# Patient Record
Sex: Male | Born: 1970
Health system: Southern US, Community
[De-identification: ages and names within clinical notes are randomized; demographics above are authoritative.]

## PROBLEM LIST (undated history)

## (undated) DIAGNOSIS — E785 Hyperlipidemia, unspecified: Secondary | ICD-10-CM

## (undated) DIAGNOSIS — N200 Calculus of kidney: Secondary | ICD-10-CM

## (undated) HISTORY — DX: Calculus of kidney: N20.0

## (undated) HISTORY — PX: ANTERIOR CRUCIATE LIGAMENT REPAIR: SHX115

## (undated) HISTORY — DX: Hyperlipidemia, unspecified: E78.5

---

## 2004-08-03 ENCOUNTER — Ambulatory Visit: Payer: Self-pay | Admitting: Family Medicine

## 2005-11-07 ENCOUNTER — Ambulatory Visit (HOSPITAL_COMMUNITY): Admission: RE | Admit: 2005-11-07 | Discharge: 2005-11-07 | Payer: Self-pay | Admitting: Internal Medicine

## 2006-04-06 ENCOUNTER — Emergency Department (HOSPITAL_COMMUNITY): Admission: EM | Admit: 2006-04-06 | Discharge: 2006-04-07 | Payer: Self-pay | Admitting: Emergency Medicine

## 2006-04-10 ENCOUNTER — Ambulatory Visit (HOSPITAL_BASED_OUTPATIENT_CLINIC_OR_DEPARTMENT_OTHER): Admission: RE | Admit: 2006-04-10 | Discharge: 2006-04-10 | Payer: Self-pay | Admitting: Orthopedic Surgery

## 2017-05-16 DIAGNOSIS — E784 Other hyperlipidemia: Secondary | ICD-10-CM | POA: Diagnosis not present

## 2017-05-16 DIAGNOSIS — R7301 Impaired fasting glucose: Secondary | ICD-10-CM | POA: Diagnosis not present

## 2017-05-16 DIAGNOSIS — Z125 Encounter for screening for malignant neoplasm of prostate: Secondary | ICD-10-CM | POA: Diagnosis not present

## 2017-05-16 DIAGNOSIS — Z0000 Encounter for general adult medical examination without abnormal findings: Secondary | ICD-10-CM | POA: Diagnosis not present

## 2017-05-23 DIAGNOSIS — Z1389 Encounter for screening for other disorder: Secondary | ICD-10-CM | POA: Diagnosis not present

## 2017-05-23 DIAGNOSIS — E784 Other hyperlipidemia: Secondary | ICD-10-CM | POA: Diagnosis not present

## 2017-05-23 DIAGNOSIS — R7301 Impaired fasting glucose: Secondary | ICD-10-CM | POA: Diagnosis not present

## 2017-05-23 DIAGNOSIS — Z0000 Encounter for general adult medical examination without abnormal findings: Secondary | ICD-10-CM | POA: Diagnosis not present

## 2017-05-23 DIAGNOSIS — G4700 Insomnia, unspecified: Secondary | ICD-10-CM | POA: Diagnosis not present

## 2017-05-23 DIAGNOSIS — R209 Unspecified disturbances of skin sensation: Secondary | ICD-10-CM | POA: Diagnosis not present

## 2018-05-19 DIAGNOSIS — Z125 Encounter for screening for malignant neoplasm of prostate: Secondary | ICD-10-CM | POA: Diagnosis not present

## 2018-05-19 DIAGNOSIS — Z0000 Encounter for general adult medical examination without abnormal findings: Secondary | ICD-10-CM | POA: Diagnosis not present

## 2018-05-19 DIAGNOSIS — E7849 Other hyperlipidemia: Secondary | ICD-10-CM | POA: Diagnosis not present

## 2018-05-19 DIAGNOSIS — R82998 Other abnormal findings in urine: Secondary | ICD-10-CM | POA: Diagnosis not present

## 2018-05-19 DIAGNOSIS — R7301 Impaired fasting glucose: Secondary | ICD-10-CM | POA: Diagnosis not present

## 2018-05-26 DIAGNOSIS — E7849 Other hyperlipidemia: Secondary | ICD-10-CM | POA: Diagnosis not present

## 2018-05-26 DIAGNOSIS — R740 Nonspecific elevation of levels of transaminase and lactic acid dehydrogenase [LDH]: Secondary | ICD-10-CM | POA: Diagnosis not present

## 2018-05-26 DIAGNOSIS — R209 Unspecified disturbances of skin sensation: Secondary | ICD-10-CM | POA: Diagnosis not present

## 2018-05-26 DIAGNOSIS — Z1389 Encounter for screening for other disorder: Secondary | ICD-10-CM | POA: Diagnosis not present

## 2018-05-26 DIAGNOSIS — Z0000 Encounter for general adult medical examination without abnormal findings: Secondary | ICD-10-CM | POA: Diagnosis not present

## 2018-05-26 DIAGNOSIS — R7301 Impaired fasting glucose: Secondary | ICD-10-CM | POA: Diagnosis not present

## 2019-01-12 DIAGNOSIS — H31002 Unspecified chorioretinal scars, left eye: Secondary | ICD-10-CM | POA: Diagnosis not present

## 2019-02-01 DIAGNOSIS — Z20828 Contact with and (suspected) exposure to other viral communicable diseases: Secondary | ICD-10-CM | POA: Diagnosis not present

## 2019-05-21 DIAGNOSIS — R7301 Impaired fasting glucose: Secondary | ICD-10-CM | POA: Diagnosis not present

## 2019-05-21 DIAGNOSIS — Z0000 Encounter for general adult medical examination without abnormal findings: Secondary | ICD-10-CM | POA: Diagnosis not present

## 2019-05-21 DIAGNOSIS — Z125 Encounter for screening for malignant neoplasm of prostate: Secondary | ICD-10-CM | POA: Diagnosis not present

## 2019-05-21 DIAGNOSIS — E7849 Other hyperlipidemia: Secondary | ICD-10-CM | POA: Diagnosis not present

## 2019-05-28 DIAGNOSIS — Z1331 Encounter for screening for depression: Secondary | ICD-10-CM | POA: Diagnosis not present

## 2019-05-28 DIAGNOSIS — E7849 Other hyperlipidemia: Secondary | ICD-10-CM | POA: Diagnosis not present

## 2019-05-28 DIAGNOSIS — R7401 Elevation of levels of liver transaminase levels: Secondary | ICD-10-CM | POA: Diagnosis not present

## 2019-05-28 DIAGNOSIS — R7301 Impaired fasting glucose: Secondary | ICD-10-CM | POA: Diagnosis not present

## 2019-05-28 DIAGNOSIS — Z0000 Encounter for general adult medical examination without abnormal findings: Secondary | ICD-10-CM | POA: Diagnosis not present

## 2019-05-28 DIAGNOSIS — J45909 Unspecified asthma, uncomplicated: Secondary | ICD-10-CM | POA: Diagnosis not present

## 2019-06-25 ENCOUNTER — Other Ambulatory Visit: Payer: Self-pay

## 2019-06-25 DIAGNOSIS — Z20822 Contact with and (suspected) exposure to covid-19: Secondary | ICD-10-CM

## 2019-06-26 LAB — NOVEL CORONAVIRUS, NAA: SARS-CoV-2, NAA: NOT DETECTED

## 2019-08-18 DIAGNOSIS — M545 Low back pain: Secondary | ICD-10-CM | POA: Diagnosis not present

## 2019-10-16 ENCOUNTER — Other Ambulatory Visit: Payer: Self-pay | Admitting: Internal Medicine

## 2019-10-16 DIAGNOSIS — E785 Hyperlipidemia, unspecified: Secondary | ICD-10-CM

## 2019-10-28 ENCOUNTER — Other Ambulatory Visit: Payer: Self-pay

## 2019-10-28 ENCOUNTER — Ambulatory Visit
Admission: RE | Admit: 2019-10-28 | Discharge: 2019-10-28 | Disposition: A | Discharge status: Home / Self Care | Payer: Self-pay | Source: Ambulatory Visit | Attending: Internal Medicine | Admitting: Internal Medicine

## 2019-10-28 DIAGNOSIS — E785 Hyperlipidemia, unspecified: Secondary | ICD-10-CM

## 2019-11-30 DIAGNOSIS — M7711 Lateral epicondylitis, right elbow: Secondary | ICD-10-CM | POA: Diagnosis not present

## 2020-04-21 DIAGNOSIS — Z20822 Contact with and (suspected) exposure to covid-19: Secondary | ICD-10-CM | POA: Diagnosis not present

## 2020-04-24 ENCOUNTER — Other Ambulatory Visit: Payer: Self-pay | Admitting: Family

## 2020-04-24 ENCOUNTER — Telehealth: Payer: Self-pay | Admitting: Family

## 2020-04-24 DIAGNOSIS — U071 COVID-19: Secondary | ICD-10-CM

## 2020-04-24 DIAGNOSIS — J4520 Mild intermittent asthma, uncomplicated: Secondary | ICD-10-CM

## 2020-04-24 DIAGNOSIS — Z6828 Body mass index (BMI) 28.0-28.9, adult: Secondary | ICD-10-CM

## 2020-04-24 NOTE — Progress Notes (Signed)
I connected by phone with Matthew Steele on 04/24/2020 at 2:29 PM to discuss the potential use of a new treatment for mild to moderate COVID-19 viral infection in non-hospitalized patients.  This patient is a 49 y.o. male that meets the FDA criteria for Emergency Use Authorization of COVID monoclonal antibody casirivimab/imdevimab.  Has a (+) direct SARS-CoV-2 viral test result  Has mild or moderate COVID-19   Is NOT hospitalized due to COVID-19  Is within 10 days of symptom onset  Has at least one of the high risk factor(s) for progression to severe COVID-19 and/or hospitalization as defined in EUA.  Specific high risk criteria : BMI > 25 and Chronic Lung Disease   I have spoken and communicated the following to the patient or parent/caregiver regarding COVID monoclonal antibody treatment:  1. FDA has authorized the emergency use for the treatment of mild to moderate COVID-19 in adults and pediatric patients with positive results of direct SARS-CoV-2 viral testing who are 23 years of age and older weighing at least 40 kg, and who are at high risk for progressing to severe COVID-19 and/or hospitalization.  2. The significant known and potential risks and benefits of COVID monoclonal antibody, and the extent to which such potential risks and benefits are unknown.  3. Information on available alternative treatments and the risks and benefits of those alternatives, including clinical trials.  4. Patients treated with COVID monoclonal antibody should continue to self-isolate and use infection control measures (e.g., wear mask, isolate, social distance, avoid sharing personal items, clean and disinfect "high touch" surfaces, and frequent handwashing) according to CDC guidelines.   5. The patient or parent/caregiver has the option to accept or refuse COVID monoclonal antibody treatment.  After reviewing this information with the patient, The patient agreed to proceed with receiving  casirivimab\imdevimab infusion and will be provided a copy of the Fact sheet prior to receiving the infusion.    Mauricio Po, NP 04/24/2020 2:29 PM

## 2020-04-24 NOTE — Telephone Encounter (Signed)
Called to Discuss with patient about Covid symptoms and the use of the monoclonal antibody infusion for those with mild to moderate Covid symptoms and at a high risk of hospitalization.     Cough Congestion, fatigue; Symptom onset 8/27. Asthma BMI >25.   Pt appears to qualify for this infusion due to co-morbid conditions and/or a member of an at-risk group in accordance with the FDA Emergency Use Authorization.  Initial onset of symptoms was 04/21/20. Currently experiencing cough, congestion and fatigue. Risk factors include BMI >25 and Asthma.   Scheduled for infusion and orders placed.   Terri Piedra, NP

## 2020-04-26 ENCOUNTER — Ambulatory Visit (HOSPITAL_COMMUNITY)
Admission: RE | Admit: 2020-04-26 | Discharge: 2020-04-26 | Disposition: A | Discharge status: Home / Self Care | Payer: BC Managed Care – PPO | Source: Ambulatory Visit | Attending: Pulmonary Disease | Admitting: Pulmonary Disease

## 2020-04-26 DIAGNOSIS — J4520 Mild intermittent asthma, uncomplicated: Secondary | ICD-10-CM | POA: Diagnosis not present

## 2020-04-26 DIAGNOSIS — Z6828 Body mass index (BMI) 28.0-28.9, adult: Secondary | ICD-10-CM | POA: Diagnosis not present

## 2020-04-26 DIAGNOSIS — U071 COVID-19: Secondary | ICD-10-CM | POA: Insufficient documentation

## 2020-04-26 MED ORDER — SODIUM CHLORIDE 0.9 % IV SOLN: INTRAVENOUS | Status: DC | PRN

## 2020-04-26 MED ORDER — SODIUM CHLORIDE 0.9 % IV SOLN
1200.0000 mg | Freq: Once | INTRAVENOUS | Status: AC
  Administered 2020-04-26: 1200 mg via INTRAVENOUS

## 2020-04-26 MED ORDER — EPINEPHRINE 0.3 MG/0.3ML IJ SOAJ: 0.3000 mg | Freq: Once | INTRAMUSCULAR | Status: DC | PRN

## 2020-04-26 MED ORDER — METHYLPREDNISOLONE SODIUM SUCC 125 MG IJ SOLR: 125.0000 mg | Freq: Once | INTRAMUSCULAR | Status: DC | PRN

## 2020-04-26 MED ORDER — ALBUTEROL SULFATE HFA 108 (90 BASE) MCG/ACT IN AERS: 2.0000 | INHALATION_SPRAY | Freq: Once | RESPIRATORY_TRACT | Status: DC | PRN

## 2020-04-26 MED ORDER — FAMOTIDINE IN NACL 20-0.9 MG/50ML-% IV SOLN: 20.0000 mg | Freq: Once | INTRAVENOUS | Status: DC | PRN

## 2020-04-26 NOTE — Discharge Instructions (Signed)
10 Things You Can Do to Manage Your COVID-19 Symptoms at Home If you have possible or confirmed COVID-19: 1. Stay home from work and school. And stay away from other public places. If you must go out, avoid using any kind of public transportation, ridesharing, or taxis. 2. Monitor your symptoms carefully. If your symptoms get worse, call your healthcare provider immediately. 3. Get rest and stay hydrated. 4. If you have a medical appointment, call the healthcare provider ahead of time and tell them that you have or may have COVID-19. 5. For medical emergencies, call 911 and notify the dispatch personnel that you have or may have COVID-19. 6. Cover your cough and sneezes with a tissue or use the inside of your elbow. 7. Wash your hands often with soap and water for at least 20 seconds or clean your hands with an alcohol-based hand sanitizer that contains at least 60% alcohol. 8. As much as possible, stay in a specific room and away from other people in your home. Also, you should use a separate bathroom, if available. If you need to be around other people in or outside of the home, wear a mask. 9. Avoid sharing personal items with other people in your household, like dishes, towels, and bedding. Clean all surfaces that are touched often, like counters, tabletops, and doorknobs. Use household cleaning sprays or wipes according to the label instructions. What types of side effects do monoclonal antibody drugs cause?  Common side effects  In general, the more common side effects caused by monoclonal antibody drugs include: Allergic reactions, such as hives or itching Flu-like signs and symptoms, including chills, fatigue, fever, and muscle aches and pains Nausea, vomiting Diarrhea Skin rashes Low blood pressure   The CDC is recommending patients who receive monoclonal antibody treatments wait at least 90 days before being vaccinated.  10. Currently, there are no data on the safety and efficacy  of mRNA COVID-19 vaccines in persons who received monoclonal antibodies or convalescent plasma as part of COVID-19 treatment. Based on the estimated half-life of such therapies as well as evidence suggesting that reinfection is uncommon in the 90 days after initial infection, vaccination should be deferred for at least 90 days, as a precautionary measure until additional information becomes available, to avoid interference of the antibody treatment with vaccine-induced immune responses. cdc.gov/coronavirus 02/24/2019  This information is not intended to replace advice given to you by your health care provider. Make sure you discuss any questions you have with your health care provider. Document Revised: 07/29/2019 Document Reviewed: 07/29/2019 Elsevier Patient Education  2020 Elsevier Inc.  

## 2020-04-26 NOTE — Progress Notes (Signed)
  Diagnosis: COVID-19  Physician: Patrick Wright, MD  Procedure: [facility] Med: casirivimab\imdevimab infusion - Provided patient with casirivimab\imdevimab fact sheet for patients, parents and caregivers prior to infusion.  Complications: No immediate complications noted.  Discharge: Discharged home   Matthew Steele [name] 04/26/2020  

## 2020-04-26 NOTE — Progress Notes (Signed)
°  Diagnosis: COVID-19 ° °Physician: Dr Patrick Wright ° °Procedure: [facility] Med: casirivimab\imdevimab infusion - Provided patient with casirivimab\imdevimab fact sheet for patients, parents and caregivers prior to infusion. ° °Complications: No immediate complications noted. ° °Discharge: Discharged home  ° °Matthew Steele L °04/26/2020 ° ° °

## 2020-09-18 IMAGING — CT CT CARDIAC CORONARY ARTERY CALCIUM SCORE
3 series · 14 of 20 positions shown, 16 images · non-contrast
Comparison: None.

CLINICAL DATA: Hyperlipidemia

EXAM:
CT CARDIAC CORONARY ARTERY CALCIUM SCORE
TECHNIQUE: Non-contrast imaging through the heart was performed using
prospective ECG gating. Image post processing was performed on an
independent workstation, allowing for quantitative analysis of the
heart and coronary arteries. Note that this exam targets the heart
and the chest was not imaged in its entirety.

[Series 2: calcium scoring 2.00 qr36 bestdiast 70% · axial · 0.35mm/px · z∈[+1515,+1599]mm · 4 of 70 slices shown]
[im 14/70  vessel]
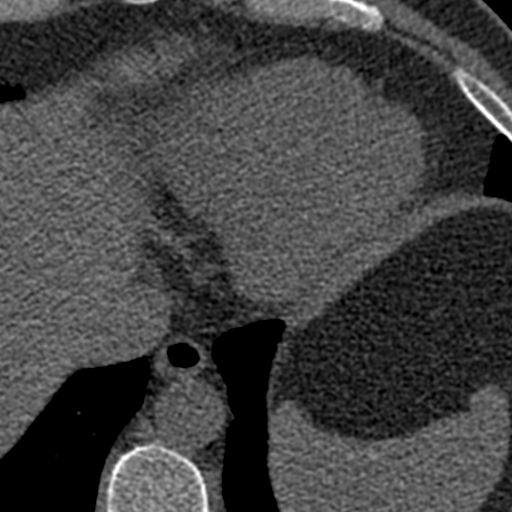
[im 28/70  vessel]
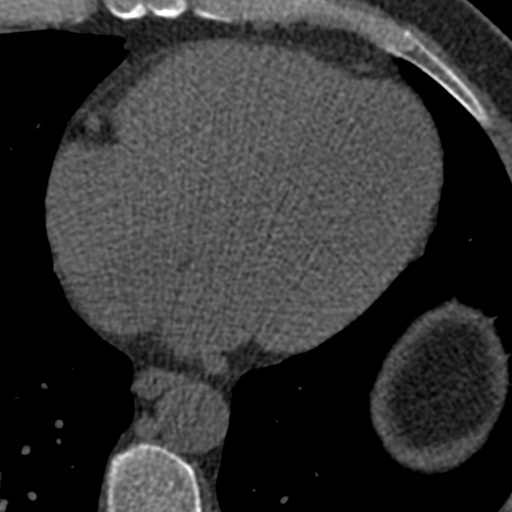
[im 42/70  vessel]
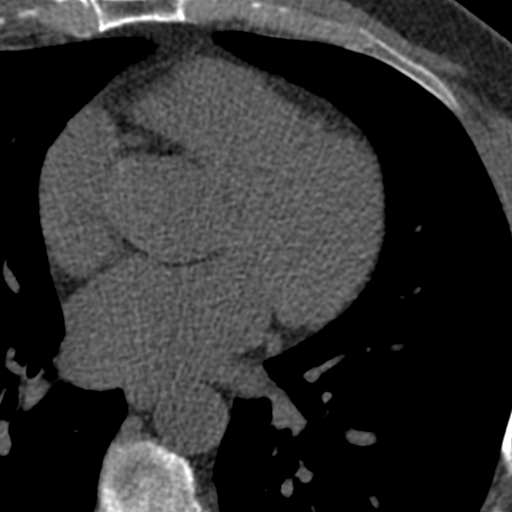
[im 56/70  vessel]
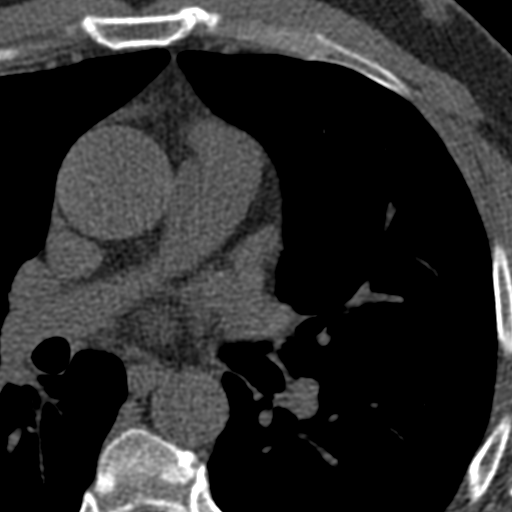

[Series 3: calcium scoring 2.00 br40 bestdiast 70% fov · axial · 0.61mm/px · z∈[+1511,+1603]mm · 5 of 70 slices shown, 7 images]
[im 12/70  vessel]
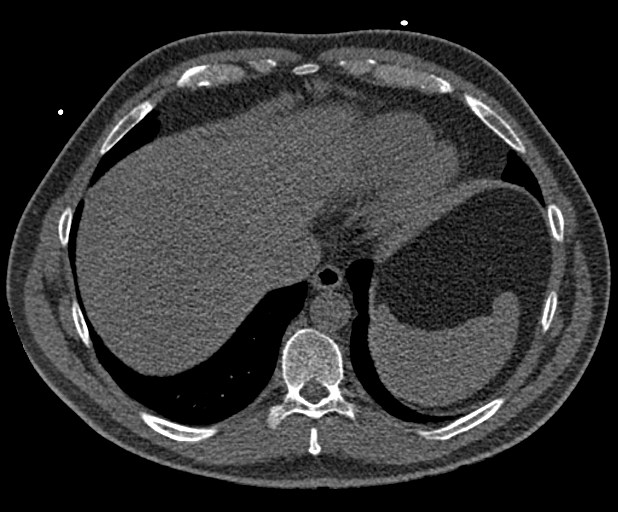
[im 12/70  lung]
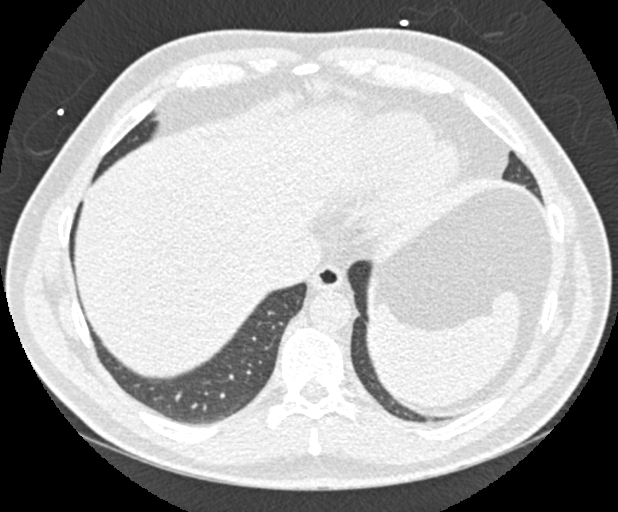
[im 24/70  vessel]
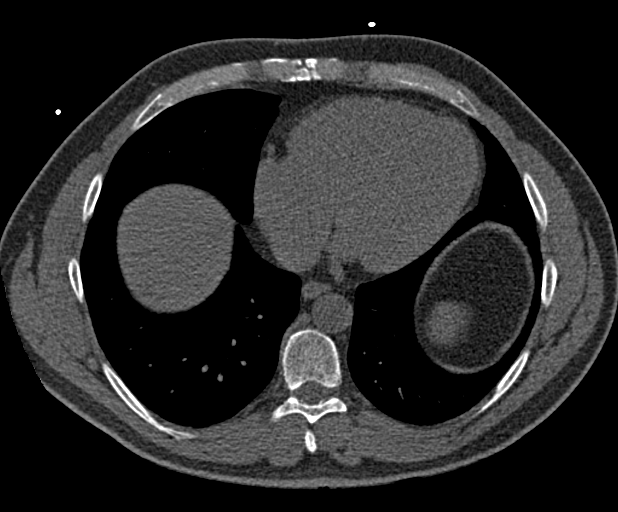
[im 35/70  vessel]
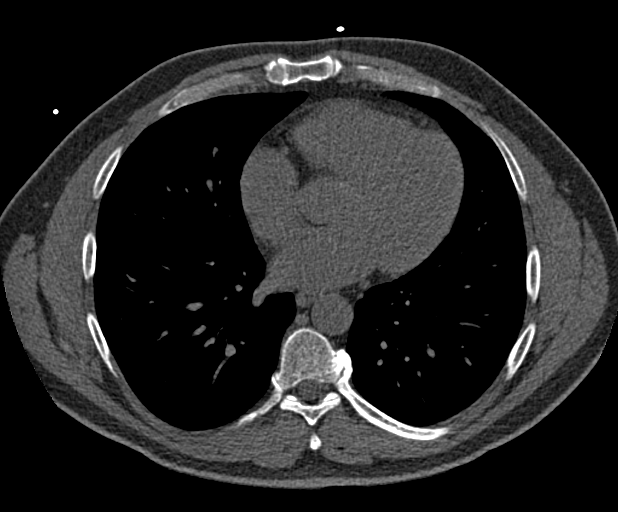
[im 47/70  vessel]
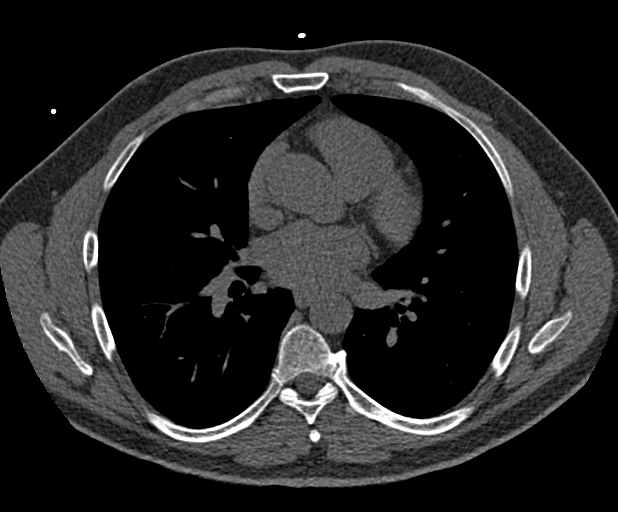
[im 58/70  vessel]
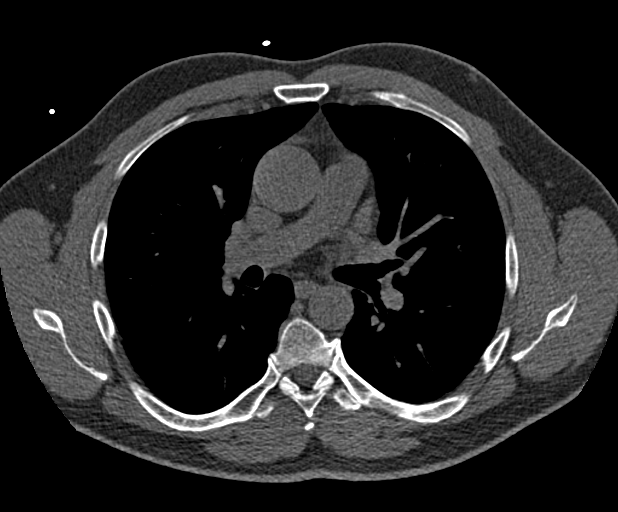
[im 58/70  lung]
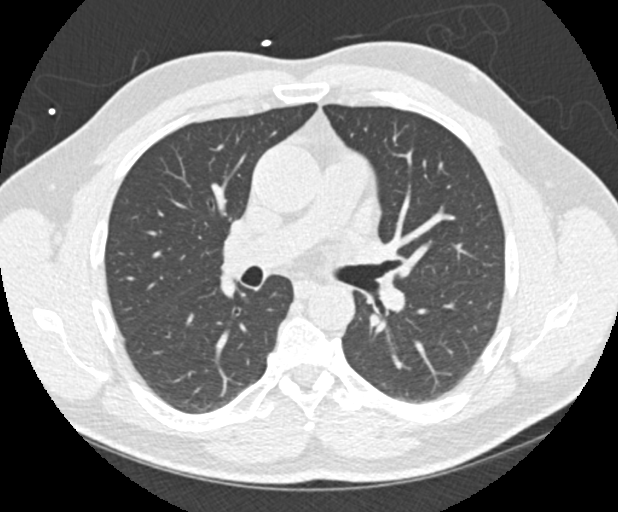

[Series 9: calcium scoring 2.00 br60 bestdiast 70% fov · axial · 0.58mm/px · z∈[+1511,+1603]mm · 5 of 70 slices shown]
[im 12/70  vessel]
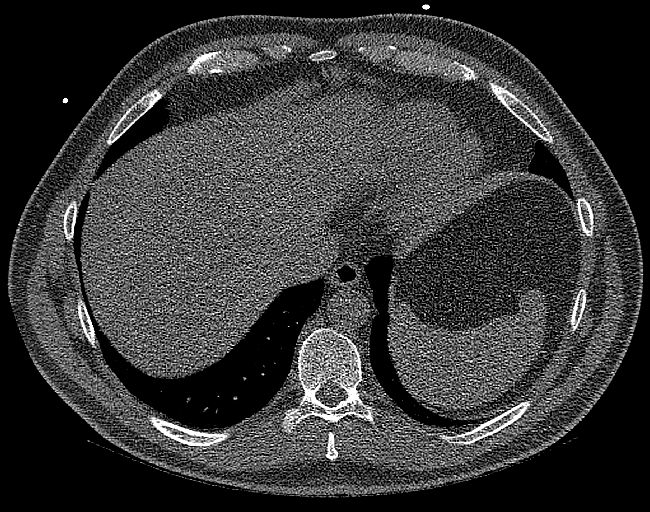
[im 24/70  vessel]
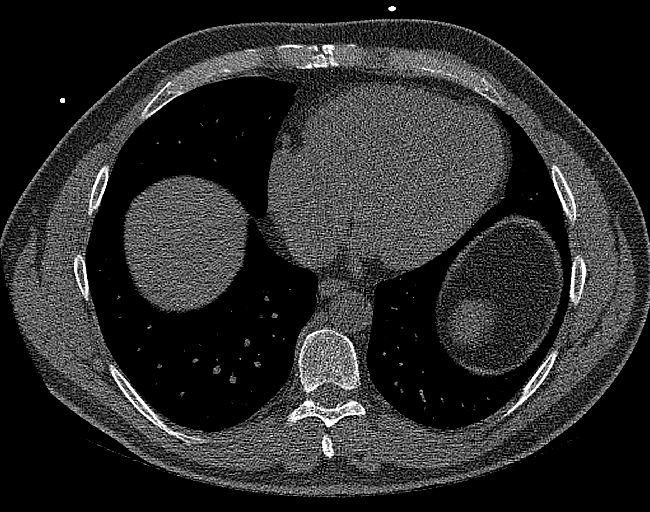
[im 35/70  vessel]
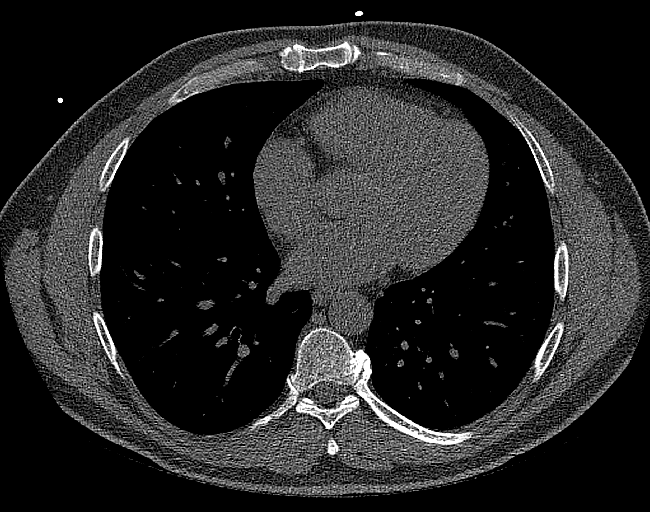
[im 47/70  vessel]
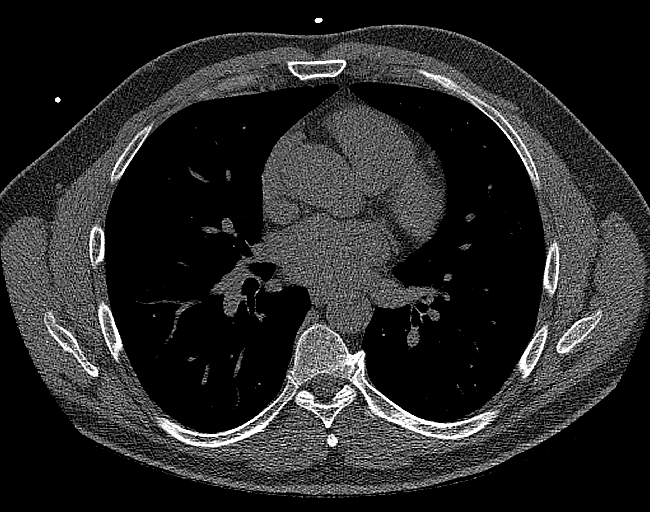
[im 58/70  vessel]
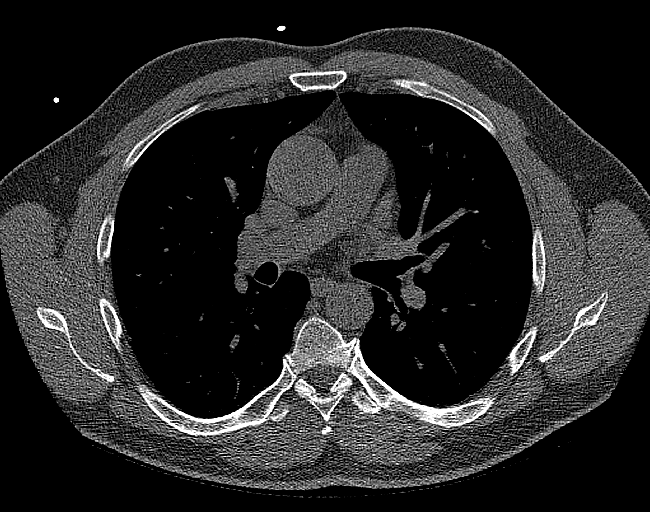

[14 of 20 positions shown; findings below may reference images not displayed]

FINDINGS: CORONARY CALCIUM SCORES:

Left Main: 0

LAD: 108

LCx: 0

RCA: 0

Total Agatston Score: 108

[HOSPITAL] percentile: 91

AORTA MEASUREMENTS:

Ascending Aorta: 40 mm

Descending Aorta: 30 mm

OTHER FINDINGS:

Heart is normal size. No adenopathy in the lower mediastinum or
hila. No confluent opacities or effusions. Imaging into the upper
abdomen shows no acute findings. No acute bony abnormality.
Bilateral gynecomastia.
IMPRESSION: The observed calcium score of 108 is at the percentile 91 for
subjects of the same age, gender and race/ethnicity who are free of
clinical cardiovascular disease and treated diabetes.

Mild bilateral gynecomastia.

4 cm ascending thoracic aortic aneurysm. Recommend annual imaging
followup by CTA or MRA. This recommendation follows 2434
ACCF/AHA/AATS/ACR/ASA/SCA/NAFFOUTI/FERIENHAUS/DENIS ARIEL/AKUUME Guidelines for the
Diagnosis and Management of Patients with Thoracic Aortic Disease.
Circulation. 2434; 121: E266-e369. Aortic aneurysm NOS ([id number]-GOT.A)

## 2020-10-18 DIAGNOSIS — Z20822 Contact with and (suspected) exposure to covid-19: Secondary | ICD-10-CM | POA: Diagnosis not present

## 2020-10-19 DIAGNOSIS — Z20822 Contact with and (suspected) exposure to covid-19: Secondary | ICD-10-CM | POA: Diagnosis not present

## 2021-04-19 DIAGNOSIS — Z125 Encounter for screening for malignant neoplasm of prostate: Secondary | ICD-10-CM | POA: Diagnosis not present

## 2021-04-19 DIAGNOSIS — E785 Hyperlipidemia, unspecified: Secondary | ICD-10-CM | POA: Diagnosis not present

## 2021-04-19 DIAGNOSIS — R7301 Impaired fasting glucose: Secondary | ICD-10-CM | POA: Diagnosis not present

## 2021-04-25 DIAGNOSIS — R82998 Other abnormal findings in urine: Secondary | ICD-10-CM | POA: Diagnosis not present

## 2021-04-25 DIAGNOSIS — E785 Hyperlipidemia, unspecified: Secondary | ICD-10-CM | POA: Diagnosis not present

## 2021-04-25 DIAGNOSIS — Z1331 Encounter for screening for depression: Secondary | ICD-10-CM | POA: Diagnosis not present

## 2021-04-25 DIAGNOSIS — Z0000 Encounter for general adult medical examination without abnormal findings: Secondary | ICD-10-CM | POA: Diagnosis not present

## 2021-04-25 DIAGNOSIS — Z1389 Encounter for screening for other disorder: Secondary | ICD-10-CM | POA: Diagnosis not present

## 2021-04-27 ENCOUNTER — Other Ambulatory Visit: Payer: Self-pay | Admitting: Internal Medicine

## 2021-04-27 DIAGNOSIS — I712 Thoracic aortic aneurysm, without rupture, unspecified: Secondary | ICD-10-CM

## 2021-05-10 DIAGNOSIS — B078 Other viral warts: Secondary | ICD-10-CM | POA: Diagnosis not present

## 2021-05-10 DIAGNOSIS — D485 Neoplasm of uncertain behavior of skin: Secondary | ICD-10-CM | POA: Diagnosis not present

## 2022-03-06 ENCOUNTER — Encounter: Payer: Self-pay | Admitting: Physician Assistant

## 2022-04-08 NOTE — Progress Notes (Unsigned)
     04/09/2022 Cortavius Montesinos [id number] [birth year]  Referring provider: Ginger Organ., MD Primary GI doctor: Dr. Candis Schatz  ASSESSMENT AND PLAN:   Screen for colon cancer We have discussed the risks of bleeding, infection, perforation, medication reactions, and remote risk of death associated with colonoscopy. All questions were answered and the patient acknowledges these risk and wishes to proceed.   Patient Care Team: Ginger Organ., MD as PCP - General (Internal Medicine)  HISTORY OF PRESENT ILLNESS: 51 y.o. male with a past medical history of kidney stones, hyperlipemia and others listed below presents for evaluation of colonoscopy.  Patient denies GERD.  Patient denies dysphagia, nausea, vomiting, melena.  Patient has a BM every day.  Patient denies change in bowel habits, constipation, diarrhea, hematochezia.  Denies changes in appetite, unintentional weight loss.  Patient denies family history of colon cancer or other gastrointestinal malignancies.     He  reports that he has quit smoking. His smoking use included cigarettes. He has quit using smokeless tobacco.  His smokeless tobacco use included chew. He reports current alcohol use. He reports that he does not use drugs.  Current Medications:  No current outpatient medications on file.  Medical History:  Past Medical History:  Diagnosis Date   Hyperlipemia    history of   Kidney stones    Allergies:  Allergies  Allergen Reactions   Antihistamines, Diphenhydramine-Type Hives    Bendryl causes hives   Diphenhydramine Hives     Surgical History:  He  has a past surgical history that includes Anterior cruciate ligament repair. Family History:  His family history is not on file.  REVIEW OF SYSTEMS  : All other systems reviewed and negative except where noted in the History of Present Illness.  PHYSICAL EXAM: BP 120/82   Pulse 68   Ht '5\' 11"'$  (1.803 m)   Wt 188 lb 6 oz (85.4 kg)   SpO2  98%   BMI 26.27 kg/m  General:   Pleasant, well developed male in no acute distress Head:   Normocephalic and atraumatic. Eyes:  sclerae anicteric,conjunctive pink  Heart:   regular rate and rhythm Pulm:  Clear anteriorly; no wheezing Abdomen:   Soft, Non-distended AB, Active bowel sounds. No tenderness . , No organomegaly appreciated. Rectal: Not evaluated Extremities:  Without edema. Msk: Symmetrical without gross deformities. Peripheral pulses intact.  Neurologic:  Alert and  oriented x4;  No focal deficits.  Skin:   Dry and intact without significant lesions or rashes. Psychiatric:  Cooperative. Normal mood and affect.    Vladimir Crofts, PA-C 9:12 AM

## 2022-04-09 ENCOUNTER — Encounter: Payer: Self-pay | Admitting: Physician Assistant

## 2022-04-09 ENCOUNTER — Ambulatory Visit (INDEPENDENT_AMBULATORY_CARE_PROVIDER_SITE_OTHER): Payer: Self-pay | Admitting: Physician Assistant

## 2022-04-09 VITALS — BP 120/82 | HR 68 | Ht 71.0 in | Wt 188.4 lb

## 2022-04-09 DIAGNOSIS — Z1211 Encounter for screening for malignant neoplasm of colon: Secondary | ICD-10-CM

## 2022-04-09 NOTE — Patient Instructions (Signed)
You have been scheduled for a colonoscopy. Please follow written instructions given to you at your visit today.  Please pick up your prep supplies at the pharmacy within the next 1-3 days. If you use inhalers (even only as needed), please bring them with you on the day of your procedure.   Due to recent changes in healthcare laws, you may see the results of your imaging and laboratory studies on MyChart before your provider has had a chance to review them.  We understand that in some cases there may be results that are confusing or concerning to you. Not all laboratory results come back in the same time frame and the provider may be waiting for multiple results in order to interpret others.  Please give [place] 48 hours in order for your provider to thoroughly review all the results before contacting the office for clarification of your results.    _______________________________________________________  If you are age 52 or older, your body mass index should be between 23-30. Your Body mass index is 26.27 kg/m. If this is out of the aforementioned range listed, please consider follow up with your Primary Care Provider.  If you are age 3 or younger, your body mass index should be between 19-25. Your Body mass index is 26.27 kg/m. If this is out of the aformentioned range listed, please consider follow up with your Primary Care Provider.   ________________________________________________________  The [facility] providers would like to encourage you to use [facility] to communicate with providers for non-urgent requests or questions.  Due to long hold times on the telephone, sending your provider a message by [facility] may be a faster and more efficient way to get a response.  Please allow 48 business hours for a response.  Please remember that this is for non-urgent requests.  _______________________________________________________   I appreciate the  opportunity to care for you  Thank You   [facility]

## 2022-04-10 NOTE — Progress Notes (Signed)
Agree with the assessment and plan as outlined by Vicie Mutters,  PA-C.  Navarro Nine E. Candis Schatz, MD

## 2022-04-30 ENCOUNTER — Encounter: Payer: Self-pay | Admitting: Gastroenterology

## 2022-05-06 ENCOUNTER — Encounter: Payer: Self-pay | Admitting: Gastroenterology

## 2022-05-06 ENCOUNTER — Ambulatory Visit (AMBULATORY_SURGERY_CENTER): Payer: 59 | Admitting: Gastroenterology

## 2022-05-06 VITALS — BP 106/76 | HR 58 | Temp 98.4°F | Resp 10 | Ht 71.0 in | Wt 188.0 lb

## 2022-05-06 DIAGNOSIS — Z1211 Encounter for screening for malignant neoplasm of colon: Secondary | ICD-10-CM

## 2022-05-06 DIAGNOSIS — D123 Benign neoplasm of transverse colon: Secondary | ICD-10-CM | POA: Diagnosis not present

## 2022-05-06 MED ORDER — SODIUM CHLORIDE 0.9 % IV SOLN: 500.0000 mL | Freq: Once | INTRAVENOUS | Status: DC

## 2022-05-06 NOTE — Patient Instructions (Signed)
Discharge instructions given. Handout on polyps. Resume previous medications. YOU HAD AN ENDOSCOPIC PROCEDURE TODAY AT THE [facility]:   Refer to the procedure report that was given to you for any specific questions about what was found during the examination.  If the procedure report does not answer your questions, please call your gastroenterologist to clarify.  If you requested that your care partner not be given the details of your procedure findings, then the procedure report has been included in a sealed envelope for you to review at your convenience later.  YOU SHOULD EXPECT: Some feelings of bloating in the abdomen. Passage of more gas than usual.  Walking can help get rid of the air that was put into your GI tract during the procedure and reduce the bloating. If you had a lower endoscopy (such as a colonoscopy or flexible sigmoidoscopy) you may notice spotting of blood in your stool or on the toilet paper. If you underwent a bowel prep for your procedure, you may not have a normal bowel movement for a few days.  Please Note:  You might notice some irritation and congestion in your nose or some drainage.  This is from the oxygen used during your procedure.  There is no need for concern and it should clear up in a day or so.  SYMPTOMS TO REPORT IMMEDIATELY:  Following lower endoscopy (colonoscopy or flexible sigmoidoscopy):  Excessive amounts of blood in the stool  Significant tenderness or worsening of abdominal pains  Swelling of the abdomen that is new, acute  Fever of 100F or higher   For urgent or emergent issues, a gastroenterologist can be reached at any hour by calling [phone]. Do not use MyChart messaging for urgent concerns.    DIET:  We do recommend a small meal at first, but then you may proceed to your regular diet.  Drink plenty of fluids but you should avoid alcoholic beverages for 24 hours.  ACTIVITY:  You should plan to take it easy for the rest  of today and you should NOT DRIVE or use heavy machinery until tomorrow (because of the sedation medicines used during the test).    FOLLOW UP: Our staff will call the number listed on your records the next business day following your procedure.  We will call around 7:15- 8:00 am to check on you and address any questions or concerns that you may have regarding the information given to you following your procedure. If we do not reach you, we will leave a message.     If any biopsies were taken you will be contacted by phone or by letter within the next 1-3 weeks.  Please call us at [phone] if you have not heard about the biopsies in 3 weeks.    SIGNATURES/CONFIDENTIALITY: You and/or your care partner have signed paperwork which will be entered into your electronic medical record.  These signatures attest to the fact that that the information above on your After Visit Summary has been reviewed and is understood.  Full responsibility of the confidentiality of this discharge information lies with you and/or your care-partner. 

## 2022-05-06 NOTE — Progress Notes (Unsigned)
Report to PACU, RN, vss, BBS= Clear.  

## 2022-05-06 NOTE — Progress Notes (Unsigned)
Called to room to assist during endoscopic procedure.  Patient ID and intended procedure confirmed with present staff. Received instructions for my participation in the procedure from the performing physician.  

## 2022-05-06 NOTE — Progress Notes (Unsigned)
History and Physical Interval Note:  05/06/2022 2:11 PM  Matthew Steele  has presented today for endoscopic procedure(s), with the diagnosis of  Encounter Diagnosis  Name Primary?   Screen for colon cancer Yes  .  The various methods of evaluation and treatment have been discussed with the patient and/or family. After consideration of risks, benefits and other options for treatment, the patient has consented to  the endoscopic procedure(s).   The patient's history has been reviewed, patient examined, no change in status, stable for endoscopic procedure(s).  I have reviewed the patient's chart and labs.  Questions were answered to the patient's satisfaction.     Drayson Dorko E. Candis Schatz, MD [facility]

## 2022-05-06 NOTE — Op Note (Signed)
[place] Patient Name: Matthew Steele Procedure Date: 05/06/2022 2:07 PM MRN: [id number] Endoscopist: Nicki Reaper E. Candis Schatz , MD Age: 51 Referring MD:  Date of Birth: 01/03/1971 Gender: Male Account #: [id number] Procedure:                Colonoscopy Indications:              Screening for colorectal malignant neoplasm, This                            is the patient's first colonoscopy Medicines:                Monitored Anesthesia Care Procedure:                Pre-Anesthesia Assessment:                           - Prior to the procedure, a History and Physical                            was performed, and patient medications and                            allergies were reviewed. The patient's tolerance of                            previous anesthesia was also reviewed. The risks                            and benefits of the procedure and the sedation                            options and risks were discussed with the patient.                            All questions were answered, and informed consent                            was obtained. Prior Anticoagulants: The patient has                            taken no previous anticoagulant or antiplatelet                            agents. ASA Grade Assessment: I - A normal, healthy                            patient. After reviewing the risks and benefits,                            the patient was deemed in satisfactory condition to                            undergo the procedure.  After obtaining informed consent, the colonoscope                            was passed under direct vision. Throughout the                            procedure, the patient's blood pressure, pulse, and                            oxygen saturations were monitored continuously. The                            Olympus CF-HQ190L [id number]) Colonoscope was                            introduced through the anus and advanced  to the the                            cecum, identified by appendiceal orifice and                            ileocecal valve. The colonoscopy was performed                            without difficulty. The patient tolerated the                            procedure well. The quality of the bowel                            preparation was adequate. The ileocecal valve,                            appendiceal orifice, and rectum were photographed.                            The bowel preparation used was Miralax via split                            dose instruction. Scope In: 2:21:05 PM Scope Out: 2:39:04 PM Scope Withdrawal Time: 0 hours 10 minutes 44 seconds  Total Procedure Duration: 0 hours 17 minutes 59 seconds  Findings:                 The perianal and digital rectal examinations were                            normal. Pertinent negatives include normal                            sphincter tone and no palpable rectal lesions.                           Two sessile polyps were found in the transverse  colon. The polyps were 3 to 4 mm in size. These                            polyps were removed with a cold snare. Resection                            and retrieval were complete. Estimated blood loss                            was minimal.                           The exam was otherwise normal throughout the                            examined colon.                           The retroflexed view of the distal rectum and anal                            verge was normal and showed no anal or rectal                            abnormalities. Complications:            No immediate complications. Estimated Blood Loss:     Estimated blood loss was minimal. Impression:               - Two 3 to 4 mm polyps in the transverse colon,                            removed with a cold snare. Resected and retrieved.                           - The distal rectum and anal verge  are normal on                            retroflexion view. Recommendation:           - Patient has a contact number available for                            emergencies. The signs and symptoms of potential                            delayed complications were discussed with the                            patient. Return to normal activities tomorrow.                            Written discharge instructions were provided to the  patient.                           - Resume previous diet.                           - Continue present medications.                           - Await pathology results.                           - Repeat colonoscopy (date not yet determined) for                            surveillance based on pathology results. Jeanene Mena E. Candis Schatz, MD 05/06/2022 2:45:08 PM This report has been signed electronically.

## 2022-05-07 ENCOUNTER — Telehealth: Payer: Self-pay

## 2022-05-07 NOTE — Telephone Encounter (Signed)
  Follow up Call-     05/06/2022    1:21 PM  Call back number  Post procedure Call Back phone  # [phone]  Permission to leave phone message Yes   Follow up call, LVM.

## 2022-05-09 NOTE — Progress Notes (Signed)
Matthew Steele,  The two polyps which I removed during your recent procedure were proven to be completely benign but are considered "pre-cancerous" polyps that MAY have grown into cancer if they had not been removed.  Studies shows that at least 20% of women over age 52 and 30% of men over age 74 have pre-cancerous polyps.  Based on current nationally recognized surveillance guidelines, I recommend that you have a repeat colonoscopy in 7 years.   If you develop any new rectal bleeding, abdominal pain or significant bowel habit changes, please contact me before then.

## 2023-07-02 ENCOUNTER — Other Ambulatory Visit: Payer: Self-pay | Admitting: Internal Medicine

## 2023-07-02 DIAGNOSIS — I7121 Aneurysm of the ascending aorta, without rupture: Secondary | ICD-10-CM

## 2023-07-15 ENCOUNTER — Ambulatory Visit
Admission: RE | Admit: 2023-07-15 | Discharge: 2023-07-15 | Disposition: A | Discharge status: Home / Self Care | Payer: 59 | Source: Ambulatory Visit | Attending: Internal Medicine | Admitting: Internal Medicine

## 2023-07-15 DIAGNOSIS — I7121 Aneurysm of the ascending aorta, without rupture: Secondary | ICD-10-CM

## 2023-07-15 MED ORDER — IOPAMIDOL (ISOVUE-370) INJECTION 76%
200.0000 mL | Freq: Once | INTRAVENOUS | Status: AC | PRN
  Administered 2023-07-15: 75 mL via INTRAVENOUS

## 2024-08-06 ENCOUNTER — Other Ambulatory Visit: Payer: Self-pay | Admitting: Internal Medicine

## 2024-08-06 DIAGNOSIS — E785 Hyperlipidemia, unspecified: Secondary | ICD-10-CM
